# Patient Record
Sex: Female | Born: 1965 | Race: White | Hispanic: No | Marital: Single | Smoking: Current some day smoker
Health system: Southern US, Community
[De-identification: ages and names within clinical notes are randomized; demographics above are authoritative.]

## PROBLEM LIST (undated history)

## (undated) DIAGNOSIS — I639 Cerebral infarction, unspecified: Secondary | ICD-10-CM

## (undated) DIAGNOSIS — I10 Essential (primary) hypertension: Secondary | ICD-10-CM

## (undated) HISTORY — PX: CHOLECYSTECTOMY: SHX55

---

## 1998-01-08 ENCOUNTER — Emergency Department (HOSPITAL_COMMUNITY): Admission: EM | Admit: 1998-01-08 | Discharge: 1998-01-08 | Payer: Self-pay | Admitting: Emergency Medicine

## 1999-06-13 ENCOUNTER — Emergency Department (HOSPITAL_COMMUNITY): Admission: EM | Admit: 1999-06-13 | Discharge: 1999-06-13 | Payer: Self-pay | Admitting: Emergency Medicine

## 2000-08-26 ENCOUNTER — Encounter: Payer: Self-pay | Admitting: Emergency Medicine

## 2000-08-26 ENCOUNTER — Emergency Department (HOSPITAL_COMMUNITY): Admission: EM | Admit: 2000-08-26 | Discharge: 2000-08-26 | Payer: Self-pay | Admitting: Emergency Medicine

## 2002-03-20 ENCOUNTER — Emergency Department (HOSPITAL_COMMUNITY): Admission: EM | Admit: 2002-03-20 | Discharge: 2002-03-20 | Payer: Self-pay | Admitting: Emergency Medicine

## 2013-01-13 ENCOUNTER — Emergency Department (HOSPITAL_COMMUNITY): Payer: Disability Insurance

## 2013-01-13 ENCOUNTER — Emergency Department (HOSPITAL_COMMUNITY): Payer: Self-pay

## 2013-01-13 ENCOUNTER — Encounter (HOSPITAL_COMMUNITY): Payer: Self-pay

## 2013-01-13 ENCOUNTER — Emergency Department (HOSPITAL_COMMUNITY)
Admission: EM | Admit: 2013-01-13 | Discharge: 2013-01-13 | Disposition: A | Discharge status: Home / Self Care | Payer: Disability Insurance | Attending: Emergency Medicine | Admitting: Emergency Medicine

## 2013-01-13 DIAGNOSIS — Y9389 Activity, other specified: Secondary | ICD-10-CM | POA: Insufficient documentation

## 2013-01-13 DIAGNOSIS — F17200 Nicotine dependence, unspecified, uncomplicated: Secondary | ICD-10-CM | POA: Insufficient documentation

## 2013-01-13 DIAGNOSIS — T3991XA Poisoning by unspecified nonopioid analgesic, antipyretic and antirheumatic, accidental (unintentional), initial encounter: Secondary | ICD-10-CM | POA: Insufficient documentation

## 2013-01-13 DIAGNOSIS — Y929 Unspecified place or not applicable: Secondary | ICD-10-CM | POA: Insufficient documentation

## 2013-01-13 DIAGNOSIS — T398X1A Poisoning by other nonopioid analgesics and antipyretics, not elsewhere classified, accidental (unintentional), initial encounter: Secondary | ICD-10-CM | POA: Insufficient documentation

## 2013-01-13 DIAGNOSIS — J209 Acute bronchitis, unspecified: Secondary | ICD-10-CM | POA: Insufficient documentation

## 2013-01-13 DIAGNOSIS — R0789 Other chest pain: Secondary | ICD-10-CM | POA: Insufficient documentation

## 2013-01-13 DIAGNOSIS — L509 Urticaria, unspecified: Secondary | ICD-10-CM | POA: Insufficient documentation

## 2013-01-13 DIAGNOSIS — I10 Essential (primary) hypertension: Secondary | ICD-10-CM | POA: Insufficient documentation

## 2013-01-13 DIAGNOSIS — J40 Bronchitis, not specified as acute or chronic: Secondary | ICD-10-CM

## 2013-01-13 DIAGNOSIS — T7840XA Allergy, unspecified, initial encounter: Secondary | ICD-10-CM

## 2013-01-13 HISTORY — DX: Essential (primary) hypertension: I10

## 2013-01-13 LAB — COMPREHENSIVE METABOLIC PANEL
ALT: 43 U/L — ABNORMAL HIGH (ref 0–35)
AST: 26 U/L (ref 0–37)
Albumin: 4 g/dL (ref 3.5–5.2)
Alkaline Phosphatase: 74 U/L (ref 39–117)
BUN: 11 mg/dL (ref 6–23)
CO2: 28 mEq/L (ref 19–32)
Calcium: 9.8 mg/dL (ref 8.4–10.5)
Chloride: 99 mEq/L (ref 96–112)
Creatinine, Ser: 0.82 mg/dL (ref 0.50–1.10)
GFR calc Af Amer: >90 mL/min (ref >90)
GFR calc non Af Amer: 83 mL/min — ABNORMAL LOW (ref >90)
Glucose, Bld: 177 mg/dL — ABNORMAL HIGH (ref 70–99)
Potassium: 3.5 mEq/L (ref 3.5–5.1)
Sodium: 138 mEq/L (ref 135–145)
Total Bilirubin: 0.2 mg/dL — ABNORMAL LOW (ref 0.3–1.2)
Total Protein: 7.2 g/dL (ref 6.0–8.3)

## 2013-01-13 LAB — CBC WITH DIFFERENTIAL/PLATELET
Basophils Absolute: 0 10*3/uL (ref 0.0–0.1)
Eosinophils Relative: 2 % (ref 0–5)
Lymphocytes Relative: 14 % (ref 12–46)
Lymphs Abs: 1.4 10*3/uL (ref 0.7–4.0)
MCV: 89 fL (ref 78.0–100.0)
Neutro Abs: 8.3 10*3/uL — ABNORMAL HIGH (ref 1.7–7.7)
Platelets: 201 10*3/uL (ref 150–400)
RBC: 4.46 MIL/uL (ref 3.87–5.11)
WBC: 10.4 10*3/uL (ref 4.0–10.5)

## 2013-01-13 LAB — TROPONIN I: Troponin I: <0.3 ng/mL (ref <0.30)

## 2013-01-13 MED ORDER — ALBUTEROL SULFATE HFA 108 (90 BASE) MCG/ACT IN AERS
2.0000 | INHALATION_SPRAY | RESPIRATORY_TRACT | Status: DC | PRN
  Administered 2013-01-13: 2 via RESPIRATORY_TRACT
  Filled 2013-01-13: qty 6.7

## 2013-01-13 MED ORDER — AZITHROMYCIN 250 MG PO TABS: ORAL_TABLET | ORAL | Status: DC

## 2013-01-13 MED ORDER — ACETAMINOPHEN 325 MG PO TABS
650.0000 mg | ORAL_TABLET | Freq: Once | ORAL | Status: AC
  Administered 2013-01-13: 650 mg via ORAL
  Filled 2013-01-13: qty 2

## 2013-01-13 MED ORDER — DIPHENHYDRAMINE HCL 25 MG PO TABS: 50.0000 mg | ORAL_TABLET | Freq: Four times a day (QID) | ORAL | Status: DC | PRN

## 2013-01-13 MED ORDER — SODIUM CHLORIDE 0.9 % IV SOLN
Freq: Once | INTRAVENOUS | Status: AC
  Administered 2013-01-13: 20 mL/h via INTRAVENOUS

## 2013-01-13 MED ORDER — ALBUTEROL SULFATE (5 MG/ML) 0.5% IN NEBU
5.0000 mg | INHALATION_SOLUTION | Freq: Once | RESPIRATORY_TRACT | Status: AC
  Administered 2013-01-13: 5 mg via RESPIRATORY_TRACT
  Filled 2013-01-13: qty 1
  Filled 2013-01-13: qty 0.5

## 2013-01-13 MED ORDER — IPRATROPIUM BROMIDE 0.02 % IN SOLN
0.5000 mg | Freq: Once | RESPIRATORY_TRACT | Status: AC
  Administered 2013-01-13: 0.5 mg via RESPIRATORY_TRACT
  Filled 2013-01-13: qty 2.5

## 2013-01-13 MED ORDER — DEXAMETHASONE 10 MG/ML FOR PEDIATRIC ORAL USE
10.0000 mg | Freq: Once | INTRAMUSCULAR | Status: AC
  Administered 2013-01-13: 10 mg via ORAL
  Filled 2013-01-13: qty 1

## 2013-01-13 MED ORDER — FAMOTIDINE IN NACL 20-0.9 MG/50ML-% IV SOLN
20.0000 mg | Freq: Once | INTRAVENOUS | Status: AC
  Administered 2013-01-13: 20 mg via INTRAVENOUS
  Filled 2013-01-13: qty 50

## 2013-01-13 MED ORDER — DIPHENHYDRAMINE HCL 50 MG/ML IJ SOLN
25.0000 mg | Freq: Once | INTRAMUSCULAR | Status: AC
  Administered 2013-01-13: 25 mg via INTRAVENOUS
  Filled 2013-01-13: qty 1

## 2013-01-13 NOTE — ED Provider Notes (Signed)
CSN: [id number]     Arrival date & time 01/13/13  0002 History   First MD Initiated Contact with Patient 01/13/13 0054     Chief Complaint  Patient presents with  . Allergic Reaction   (Consider location/radiation/quality/duration/timing/severity/associated sxs/prior Treatment) HPI Is a 47 year old female who has had symptoms of respiratory infection for the 4-5 days. Specifically she has had a fever, cough, chest congestion and chest soreness. Yesterday morning she took 2 different over-the-counter cough/cold medications and developed generalized hives. She took 25 mg of Benadryl at 6 PM yesterday and another 25 mg at 10 PM yesterday with some relief. The hives are described as itchy. The symptoms are moderate. They have been associated with worsening of her chest tightness. She states she's had difficulty taking a deep breath because of rib soreness anteriorly and she feels like she needs to cough but is having difficulty coughing. Her symptoms are worse when lying supine. She denies rhinorrhea or nasal congestion.  Past Medical History  Diagnosis Date  . Hypertension    Past Surgical History  Procedure Laterality Date  . Cholecystectomy     No family history on file. History  Substance Use Topics  . Smoking status: Current Some Day Smoker  . Smokeless tobacco: Not on file  . Alcohol Use: No   OB History   Grav Para Term Preterm Abortions TAB SAB Ect Mult Living                 Review of Systems  All other systems reviewed and are negative.    Allergies  Review of patient's allergies indicates no known allergies.  Home Medications   Current Outpatient Rx  Name  Route  Sig  Dispense  Refill  . dextromethorphan-guaiFENesin (MUCINEX DM) 30-600 MG per 12 hr tablet   Oral   Take 1 tablet by mouth every 12 (twelve) hours.         . diphenhydrAMINE (SOMINEX) 25 MG tablet   Oral   Take 25 mg by mouth at bedtime as needed for sleep.          BP 153/88  Pulse 80   Temp(Src) 99 F (37.2 C) (Oral)  Resp 20  Ht 5\' 6"  (1.676 m)  Wt 240 lb (108.863 kg)  BMI 38.76 kg/m2  SpO2 95%  LMP 01/10/2013  Physical Exam General: Well-developed, well-nourished female in no acute distress; appearance consistent with age of record HENT: normocephalic; atraumatic Eyes: pupils equal, round and reactive to light; extraocular muscles intact Neck: supple Heart: regular rate and rhythm Lungs: Rhonchi on inspiration and expiration, reduced air movement in right base Chest: Anterior rib tenderness bilaterally Abdomen: soft; nondistended; nontender; bowel sounds present Extremities: No deformity; full range of motion; pulses normal; no edema Neurologic: Awake, alert and oriented; motor function intact in all extremities and symmetric; no facial droop Skin: Warm and dry Psychiatric: Normal mood and affect    ED Course  Procedures (including critical care time)   MDM   Nursing notes and vitals signs, including pulse oximetry, reviewed.  Summary of this visit's results, reviewed by myself:  Labs:  Results for orders placed during the hospital encounter of 01/13/13 (from the past 24 hour(s))  CBC WITH DIFFERENTIAL     Status: Abnormal   Collection Time    01/13/13 12:45 AM      Result Value Range   WBC 10.4  4.0 - 10.5 K/uL   RBC 4.46  3.87 - 5.11 MIL/uL   Hemoglobin 13.4  12.0 - 15.0 g/dL   HCT 16.1  09.6 - 04.5 %   MCV 89.0  78.0 - 100.0 fL   MCH 30.0  26.0 - 34.0 pg   MCHC 33.8  30.0 - 36.0 g/dL   RDW 40.9  81.1 - 91.4 %   Platelets 201  150 - 400 K/uL   Neutrophils Relative % 79 (*) 43 - 77 %   Neutro Abs 8.3 (*) 1.7 - 7.7 K/uL   Lymphocytes Relative 14  12 - 46 %   Lymphs Abs 1.4  0.7 - 4.0 K/uL   Monocytes Relative 5  3 - 12 %   Monocytes Absolute 0.5  0.1 - 1.0 K/uL   Eosinophils Relative 2  0 - 5 %   Eosinophils Absolute 0.2  0.0 - 0.7 K/uL   Basophils Relative 0  0 - 1 %   Basophils Absolute 0.0  0.0 - 0.1 K/uL  COMPREHENSIVE METABOLIC  PANEL     Status: Abnormal   Collection Time    01/13/13 12:45 AM      Result Value Range   Sodium 138  135 - 145 mEq/L   Potassium 3.5  3.5 - 5.1 mEq/L   Chloride 99  96 - 112 mEq/L   CO2 28  19 - 32 mEq/L   Glucose, Bld 177 (*) 70 - 99 mg/dL   BUN 11  6 - 23 mg/dL   Creatinine, Ser 7.82  0.50 - 1.10 mg/dL   Calcium 9.8  8.4 - 95.6 mg/dL   Total Protein 7.2  6.0 - 8.3 g/dL   Albumin 4.0  3.5 - 5.2 g/dL   AST 26  0 - 37 U/L   ALT 43 (*) 0 - 35 U/L   Alkaline Phosphatase 74  39 - 117 U/L   Total Bilirubin 0.2 (*) 0.3 - 1.2 mg/dL   GFR calc non Af Amer 83 (*) >90 mL/min   GFR calc Af Amer >90  >90 mL/min  TROPONIN I     Status: None   Collection Time    01/13/13 12:45 AM      Result Value Range   Troponin I <0.30  <0.30 ng/mL    Imaging Studies: Dg Chest 1 View  01/13/2013   *RADIOLOGY REPORT*  Clinical Data: Allergic reaction, cough.  CHEST - 1 VIEW  Comparison: None available at time of study interpretation.  Findings: Mild habitus limited examination.  Cardiomediastinal silhouette is unremarkable.  The patient is mildly rotated to the right accentuating right pulmonary hilar vasculature.  No definite pleural effusions or focal consolidations.  Trachea projects midline and there is no pneumothorax.  EKG leads overlie the patient.  Thoracic dextroscoliosis.  IMPRESSION: No acute cardiopulmonary process.   Original Report Authenticated By: Awilda Metro   Dg Chest 2 View  01/13/2013   *RADIOLOGY REPORT*  Clinical Data: Allergic reaction.  CHEST - 2 VIEW  Comparison: Chest radiograph January 10, 2013.  Findings: Cardiomediastinal silhouette is unremarkable and unchanged.  More apparent mild perihilar peribronchial cuffing. There is no pleural effusions or focal consolidations.  Pulmonary vasculature is unremarkable.  Trachea projects midline and there is no pneumothorax.  Multiple EKG leads overlie the patient.  Mild mid thoracic dextroscoliosis.  IMPRESSION: Mild perihilar  peribronchial cuffing which could reflect early pulmonary edema or bronchitis/reactive airway disease.   Original Report Authenticated By: Awilda Metro      EKG Interpretation:  Date & Time: 01/13/2013 12:12 AM  Rate: 87  Rhythm: normal sinus rhythm  QRS Axis: left  Intervals: normal  ST/T Wave abnormalities: Inferior T wave inversions  Conduction Disutrbances:none  Narrative Interpretation: Poor R-wave progression  Old EKG Reviewed: none available  3:51 AM Urticaria resolved after IV Benadryl and Pepcid. Air movement improved after albuterol and Atrovent neb treatment; patient has significant subjective improvement in her breathing and is now able to cough more freely.    Hanley Seamen, MD 01/13/13 [phone]

## 2013-01-13 NOTE — ED Notes (Signed)
Took benadryl at 1800 and repeated at 22:00.  States that seems to have helped her relax some.

## 2013-01-13 NOTE — ED Notes (Signed)
Pt reports she has been coughing and had congestion, states she took 2 different types of mucinex type meds and thinks she may have taken too much.  Pt states she broke out in hives and is having tightness in her chest and shoulders, also is itching.

## 2013-01-13 NOTE — ED Notes (Signed)
Up to bathroom and when returned to room shows urticaria returning on chest.  States she thinks she scratched it while asleep.  MD informed and order received

## 2013-01-13 NOTE — ED Notes (Addendum)
C/o extreme thirst.  Generalized headache.  Took a BC powder this pm  Thinks she may have been running a fever all day

## 2013-06-24 ENCOUNTER — Encounter (HOSPITAL_COMMUNITY): Payer: Self-pay | Admitting: Emergency Medicine

## 2013-06-24 ENCOUNTER — Emergency Department (HOSPITAL_COMMUNITY)
Admission: EM | Admit: 2013-06-24 | Discharge: 2013-06-24 | Discharge status: Against Medical Advice | Payer: Disability Insurance | Attending: Emergency Medicine | Admitting: Emergency Medicine

## 2013-06-24 DIAGNOSIS — Z8673 Personal history of transient ischemic attack (TIA), and cerebral infarction without residual deficits: Secondary | ICD-10-CM | POA: Insufficient documentation

## 2013-06-24 DIAGNOSIS — G43909 Migraine, unspecified, not intractable, without status migrainosus: Secondary | ICD-10-CM | POA: Insufficient documentation

## 2013-06-24 DIAGNOSIS — F17200 Nicotine dependence, unspecified, uncomplicated: Secondary | ICD-10-CM | POA: Insufficient documentation

## 2013-06-24 DIAGNOSIS — Z79899 Other long term (current) drug therapy: Secondary | ICD-10-CM | POA: Insufficient documentation

## 2013-06-24 DIAGNOSIS — I10 Essential (primary) hypertension: Secondary | ICD-10-CM | POA: Insufficient documentation

## 2013-06-24 HISTORY — DX: Cerebral infarction, unspecified: I63.9

## 2013-06-24 MED ORDER — SODIUM CHLORIDE 0.9 % IV BOLUS (SEPSIS): 1000.0000 mL | Freq: Once | INTRAVENOUS | Status: DC

## 2013-06-24 MED ORDER — METOCLOPRAMIDE HCL 5 MG/ML IJ SOLN
10.0000 mg | Freq: Once | INTRAMUSCULAR | Status: DC
  Filled 2013-06-24: qty 2

## 2013-06-24 MED ORDER — DIPHENHYDRAMINE HCL 50 MG/ML IJ SOLN
25.0000 mg | Freq: Once | INTRAMUSCULAR | Status: DC
  Filled 2013-06-24: qty 1

## 2013-06-24 NOTE — ED Notes (Signed)
Unable to locate pt  

## 2013-06-24 NOTE — ED Notes (Signed)
Complain of headache and nausea. State she has a history of migraines

## 2013-06-24 NOTE — ED Provider Notes (Signed)
CSN: [id number]     Arrival date & time 06/24/13  1200 History   First MD Initiated Contact with Patient 06/24/13 1302     Chief Complaint  Patient presents with  . Headache     (Consider location/radiation/quality/duration/timing/severity/associated sxs/prior Treatment) HPI Patient reports she has had migraine type headaches for years. She states she gets them about every other week. She typically tries to rest, take BC powders, or put a cold rag on her head to make it ease up or go away. She states she has recently stopped the [facility] powder because of the sodium content. She reports this headache started 4 days ago. She indicates it's in her temples bilaterally. She states the headache is constant and throbbing. She has nausea without vomiting. She denies any visual disturbance. She denies any numbness or tingling of her extremities. She does have photophobia and some milder noise sensitivity. She states this is just like headache she's had in the past.  PCP [facility]  First appt on March 24 [facility]   Past Medical History  Diagnosis Date  . Hypertension   . Stroke    Past Surgical History  Procedure Laterality Date  . Cholecystectomy     No family history on file. History  Substance Use Topics  . Smoking status: Current Some Day Smoker  . Smokeless tobacco: Not on file  . Alcohol Use: No   Applying for disability for bipolar and back problems Smoking 2 cig a day, trying to quit   OB History   Grav Para Term Preterm Abortions TAB SAB Ect Mult Living                 Review of Systems  All other systems reviewed and are negative.      Allergies  Review of patient's allergies indicates no known allergies.  Home Medications   Current Outpatient Rx  Name  Route  Sig  Dispense  Refill  . Aspirin-Salicylamide-Caffeine (BC HEADACHE PO)   Oral   Take 1 Package by mouth 2 (two) times daily as needed (headache).         Marland Kitchen  FLUoxetine (PROZAC) 20 MG capsule   Oral   Take 20 mg by mouth daily.         . traZODone (DESYREL) 100 MG tablet   Oral   Take 100 mg by mouth at bedtime.          BP 172/92  Pulse 79  Temp(Src) 97.8 F (36.6 C)  Resp 20  Ht 5\' 6"  (1.676 m)  Wt 250 lb (113.399 kg)  BMI 40.37 kg/m2  SpO2 100%  LMP 06/18/2013  Vital signs normal except hypertension  Physical Exam  Nursing note and vitals reviewed. Constitutional: She is oriented to person, place, and time. She appears well-developed and well-nourished.  Non-toxic appearance. She does not appear ill. No distress.  HENT:  Head: Normocephalic and atraumatic.  Right Ear: External ear normal.  Left Ear: External ear normal.  Nose: Nose normal. No mucosal edema or rhinorrhea.  Mouth/Throat: Oropharynx is clear and moist and mucous membranes are normal. No dental abscesses or uvula swelling.  Eyes: Conjunctivae and EOM are normal. Pupils are equal, round, and reactive to light.  Neck: Normal range of motion and full passive range of motion without pain. Neck supple.  Cardiovascular: Normal rate, regular rhythm and normal heart sounds.  Exam reveals no gallop and no friction rub.   No murmur heard. Pulmonary/Chest: Effort normal and  breath sounds normal. No respiratory distress. She has no wheezes. She has no rhonchi. She has no rales. She exhibits no tenderness and no crepitus.  Abdominal: Soft. Normal appearance and bowel sounds are normal. She exhibits no distension. There is no tenderness. There is no rebound and no guarding.  Musculoskeletal: Normal range of motion. She exhibits no edema and no tenderness.  Moves all extremities well.   Neurological: She is alert and oriented to person, place, and time. She has normal strength. No cranial nerve deficit.  Skin: Skin is warm, dry and intact. No rash noted. No erythema. No pallor.  Psychiatric: She has a normal mood and affect. Her speech is normal and behavior is normal. Her  mood appears not anxious.    ED Course  Procedures (including critical care time)  Medications  sodium chloride 0.9 % bolus 1,000 mL (not administered)  metoCLOPramide (REGLAN) injection 10 mg (not administered)  diphenhydrAMINE (BENADRYL) injection 25 mg (not administered)    14:10 nurses report patient not in her room. Pt did not receive ordered medication. Pt also did not indicate to me she had to leave or was unhappy with her plan.   Labs Review Labs Reviewed - No data to display Imaging Review No results found.  @NOMUSE @  MDM   Final diagnoses:  Migraine headache  Hypertension    Pt left AMA   Devoria AlbeIva Jeena Arnett, MD, Armando GangFACEP     Ward GivensIva L Clarann Helvey, MD 06/24/13 [phone]

## 2013-06-24 NOTE — ED Notes (Signed)
Went to room to medicate pt, pt not in room, EDP aware.

## 2013-08-11 ENCOUNTER — Other Ambulatory Visit (HOSPITAL_COMMUNITY): Payer: Self-pay | Admitting: Family Medicine

## 2013-08-11 ENCOUNTER — Ambulatory Visit (HOSPITAL_COMMUNITY)
Admission: RE | Admit: 2013-08-11 | Discharge: 2013-08-11 | Disposition: A | Discharge status: Home / Self Care | Payer: Disability Insurance | Source: Ambulatory Visit | Attending: Family Medicine | Admitting: Family Medicine

## 2013-08-11 DIAGNOSIS — Q762 Congenital spondylolisthesis: Secondary | ICD-10-CM | POA: Insufficient documentation

## 2013-08-11 DIAGNOSIS — M545 Low back pain, unspecified: Secondary | ICD-10-CM

## 2013-08-11 DIAGNOSIS — M47817 Spondylosis without myelopathy or radiculopathy, lumbosacral region: Secondary | ICD-10-CM | POA: Insufficient documentation

## 2013-11-08 ENCOUNTER — Other Ambulatory Visit (HOSPITAL_COMMUNITY): Payer: Self-pay | Admitting: *Deleted

## 2013-11-08 DIAGNOSIS — Z1231 Encounter for screening mammogram for malignant neoplasm of breast: Secondary | ICD-10-CM

## 2013-11-14 ENCOUNTER — Ambulatory Visit (HOSPITAL_COMMUNITY): Payer: Disability Insurance

## 2013-11-21 ENCOUNTER — Ambulatory Visit (HOSPITAL_COMMUNITY): Payer: Disability Insurance

## 2013-12-05 ENCOUNTER — Ambulatory Visit (HOSPITAL_COMMUNITY): Payer: Disability Insurance

## 2014-08-22 IMAGING — CR DG CHEST 2V
2 series · 2 of 2 positions shown · non-contrast
Comparison: Chest radiograph January 10, 2013.

CLINICAL DATA: Allergic reaction.

CHEST - 2 VIEW

[view not recorded (1 of 2)]
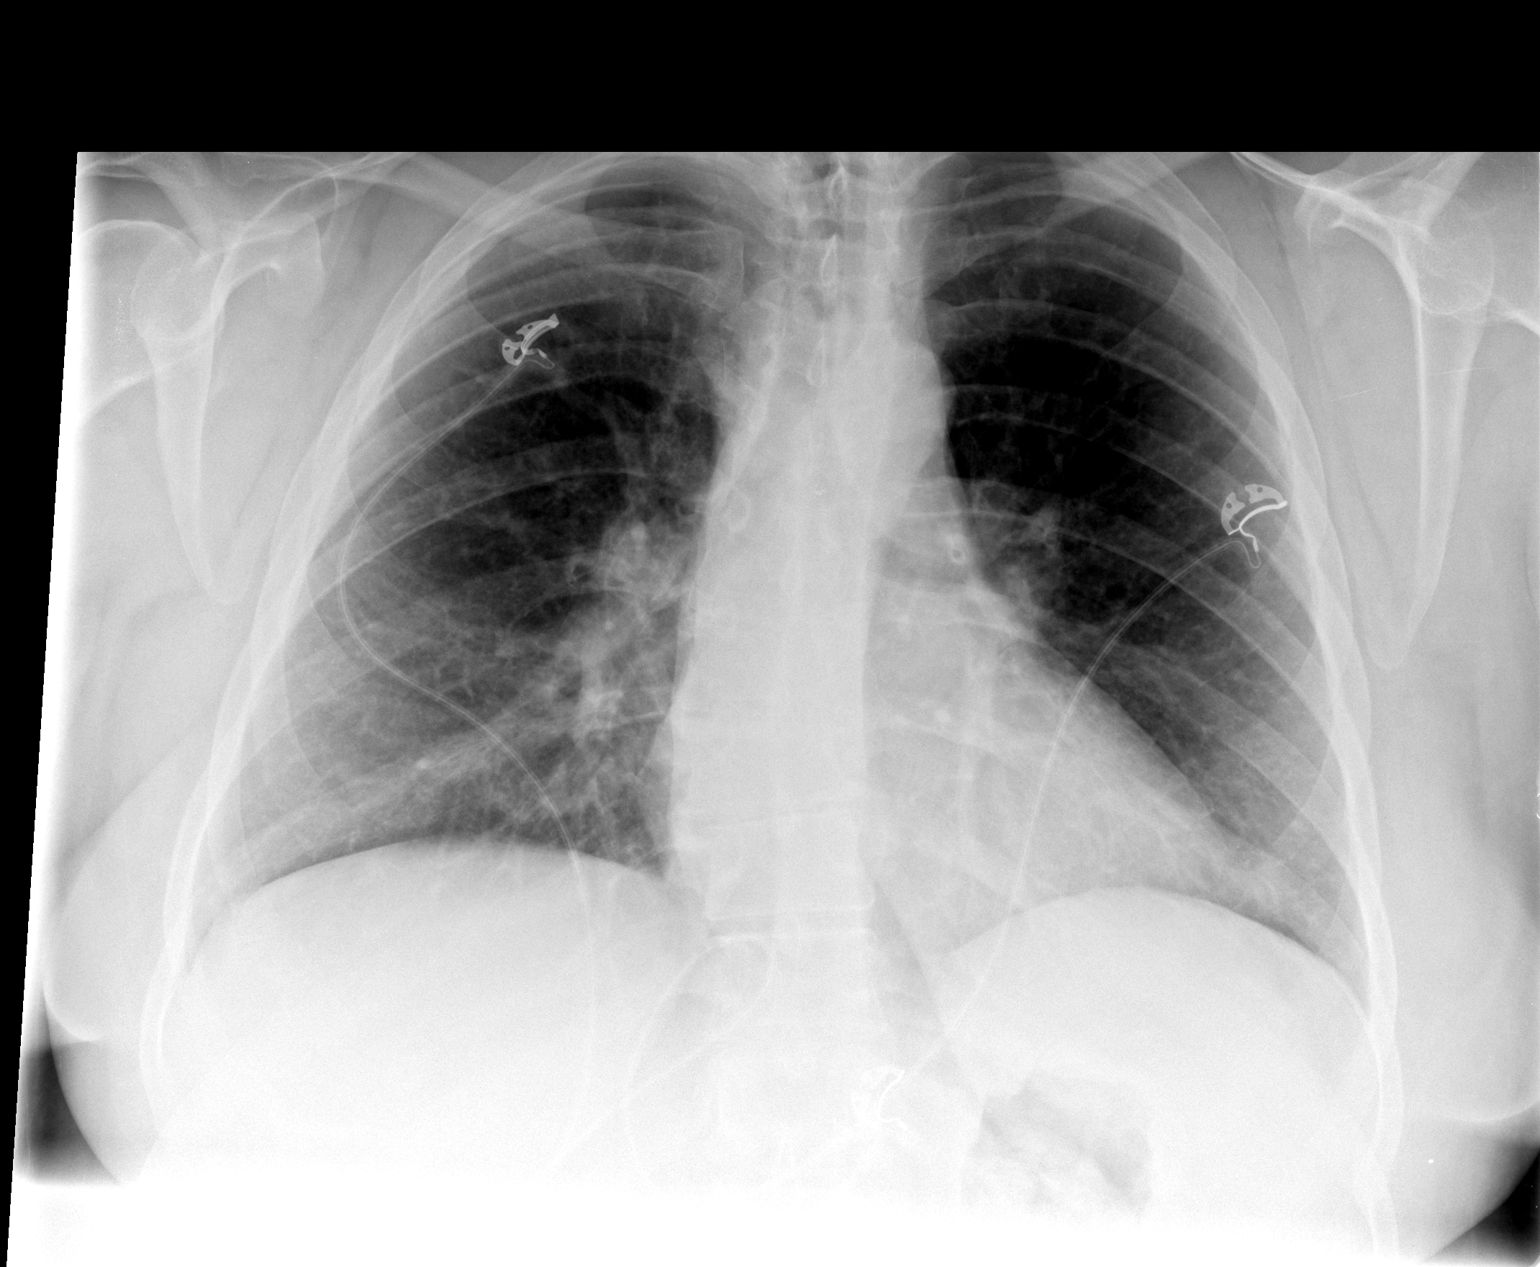

[view not recorded (2 of 2)]
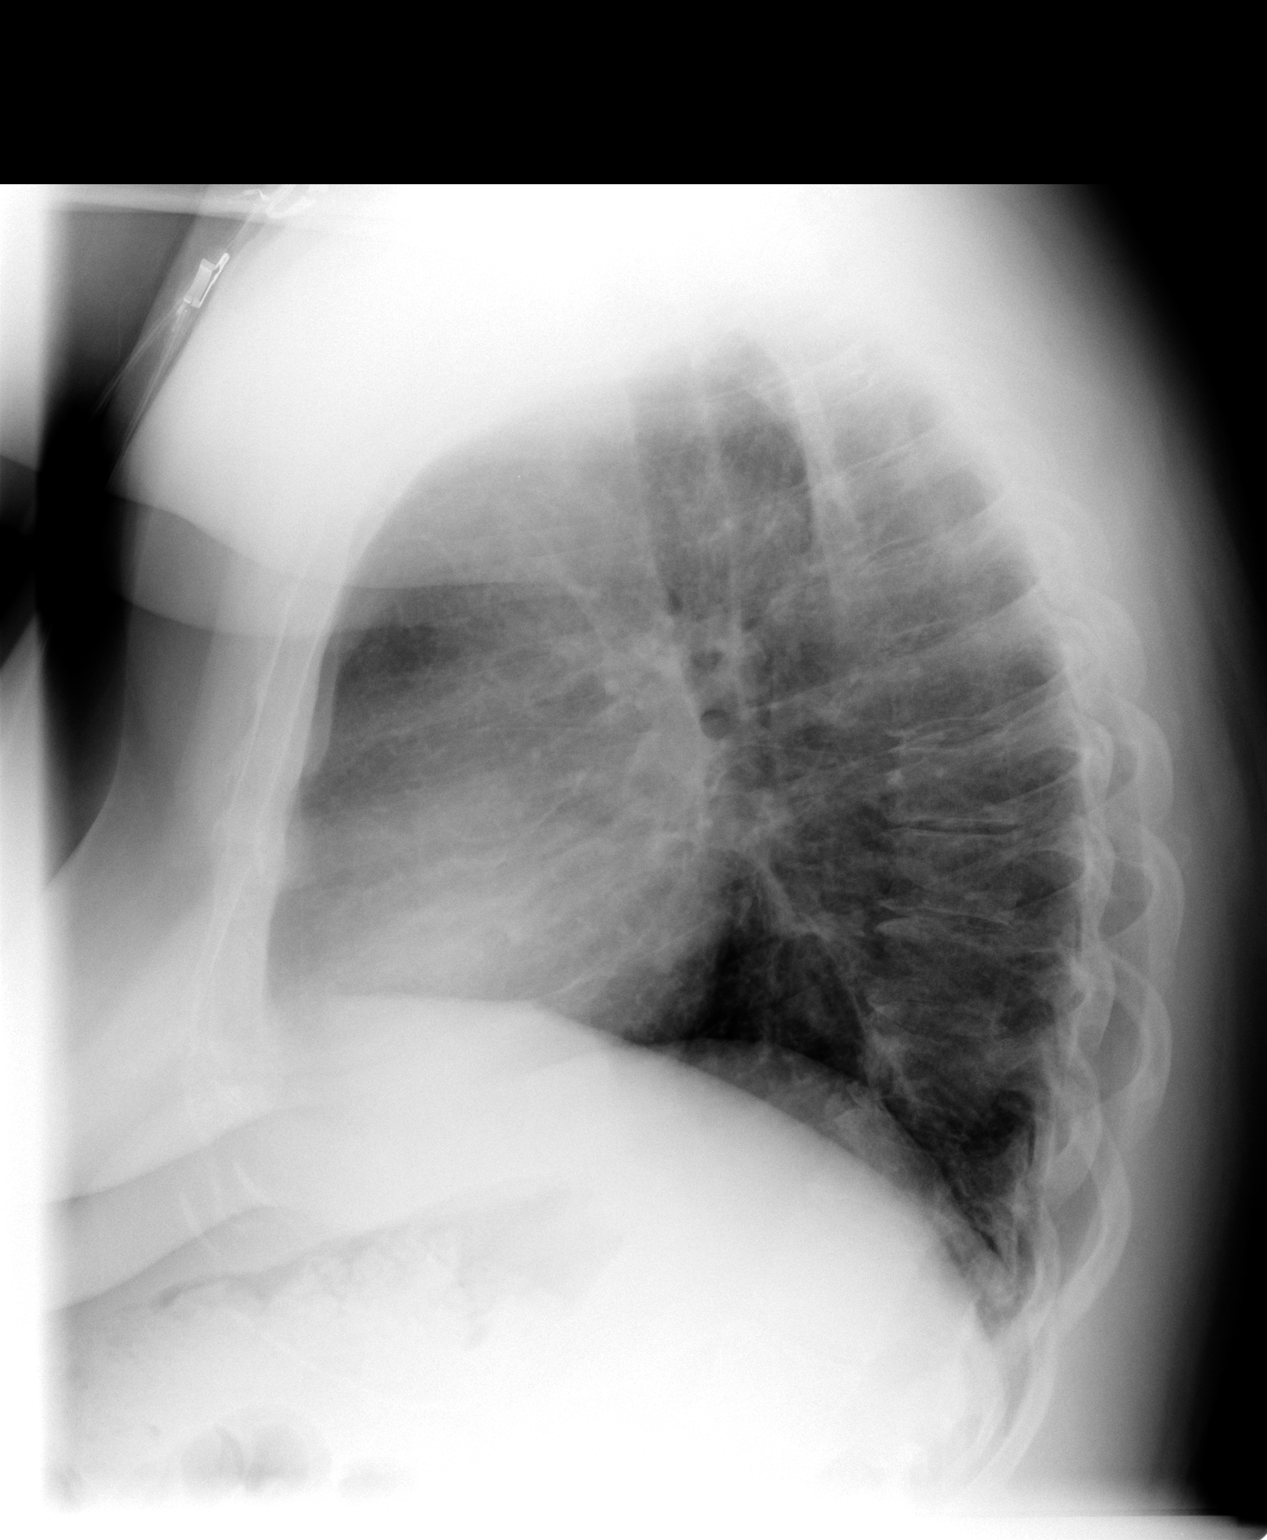

[2 of 2 positions shown; findings below may reference images not displayed]

FINDINGS: Cardiomediastinal silhouette is unremarkable and
unchanged.  More apparent mild perihilar peribronchial cuffing.
There is no pleural effusions or focal consolidations.  Pulmonary
vasculature is unremarkable.  Trachea projects midline and there is
no pneumothorax.

Multiple EKG leads overlie the patient.  Mild mid thoracic
dextroscoliosis.
IMPRESSION: Mild perihilar peribronchial cuffing which could reflect early
pulmonary edema or bronchitis/reactive airway disease.

## 2015-03-20 IMAGING — CR DG LUMBAR SPINE 2-3V
3 series · 3 of 3 positions shown · non-contrast
Comparison: None.

CLINICAL DATA: Chronic low back pain.

EXAM:
LUMBAR SPINE - 2-3 VIEW

[view not recorded (1 of 3)]
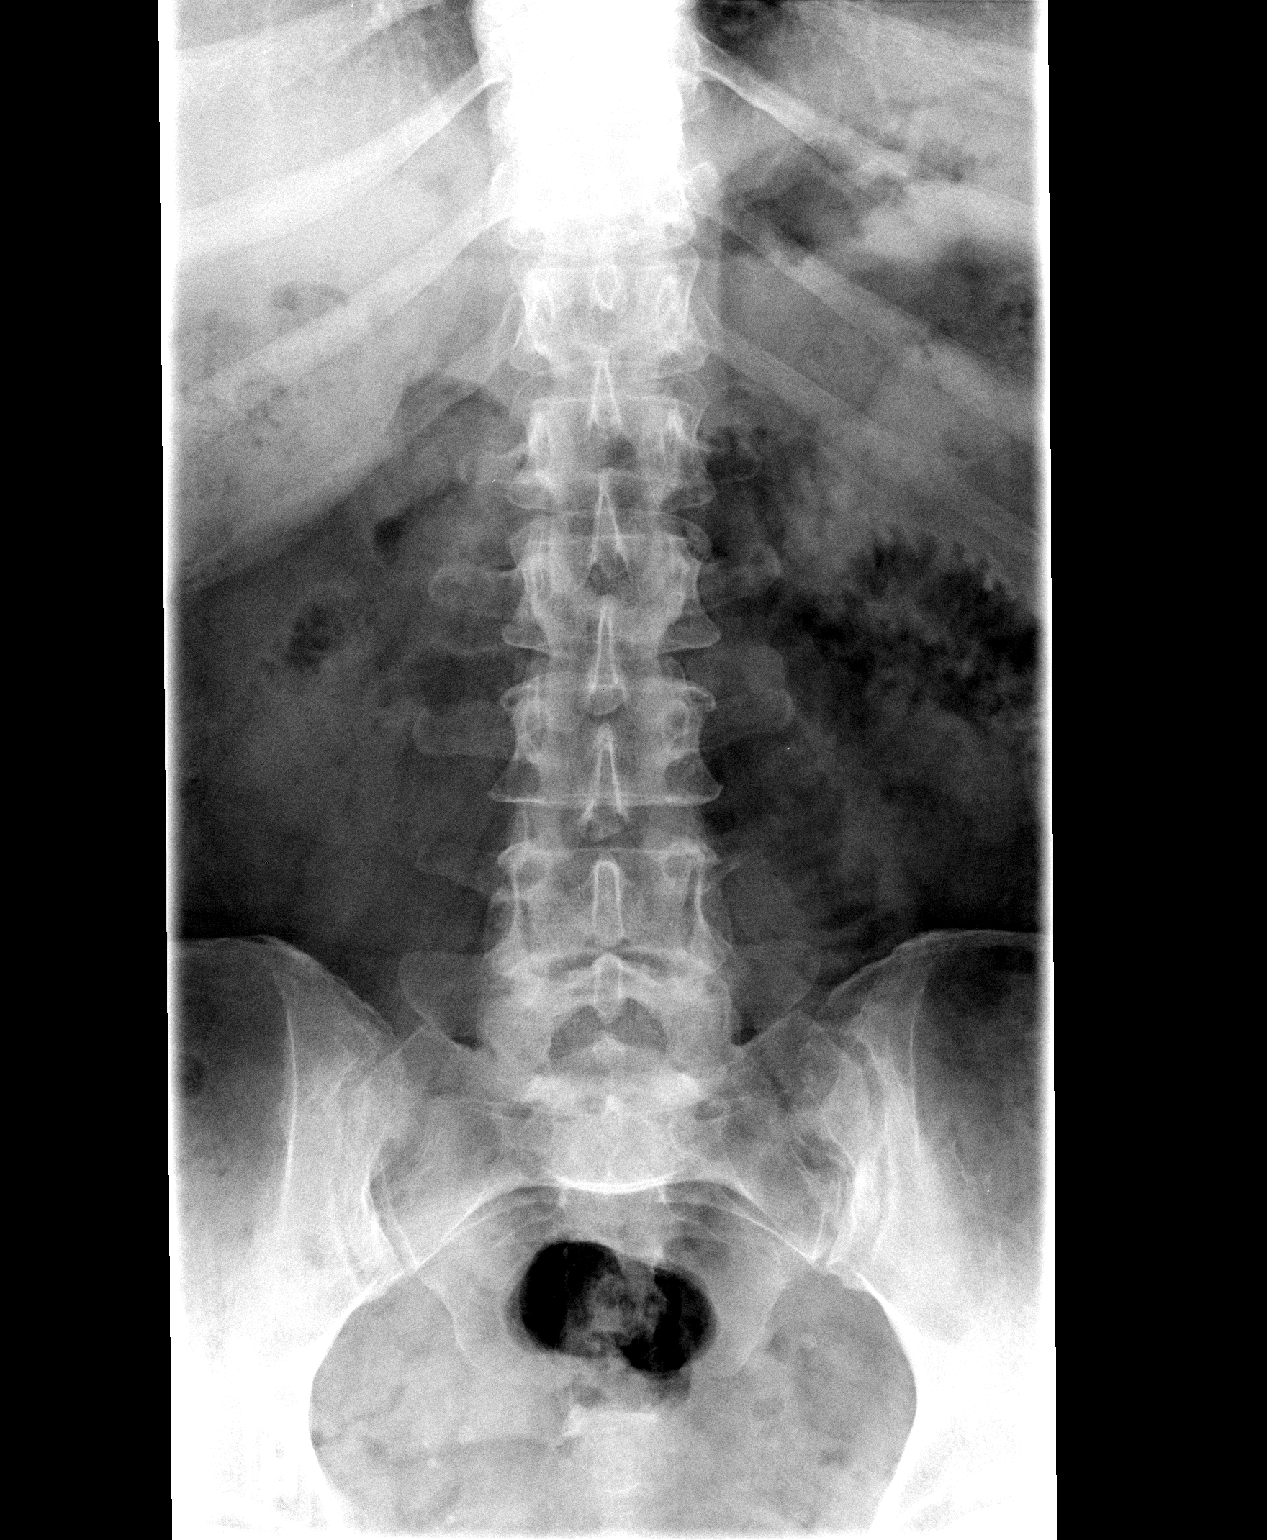

[view not recorded (2 of 3)]
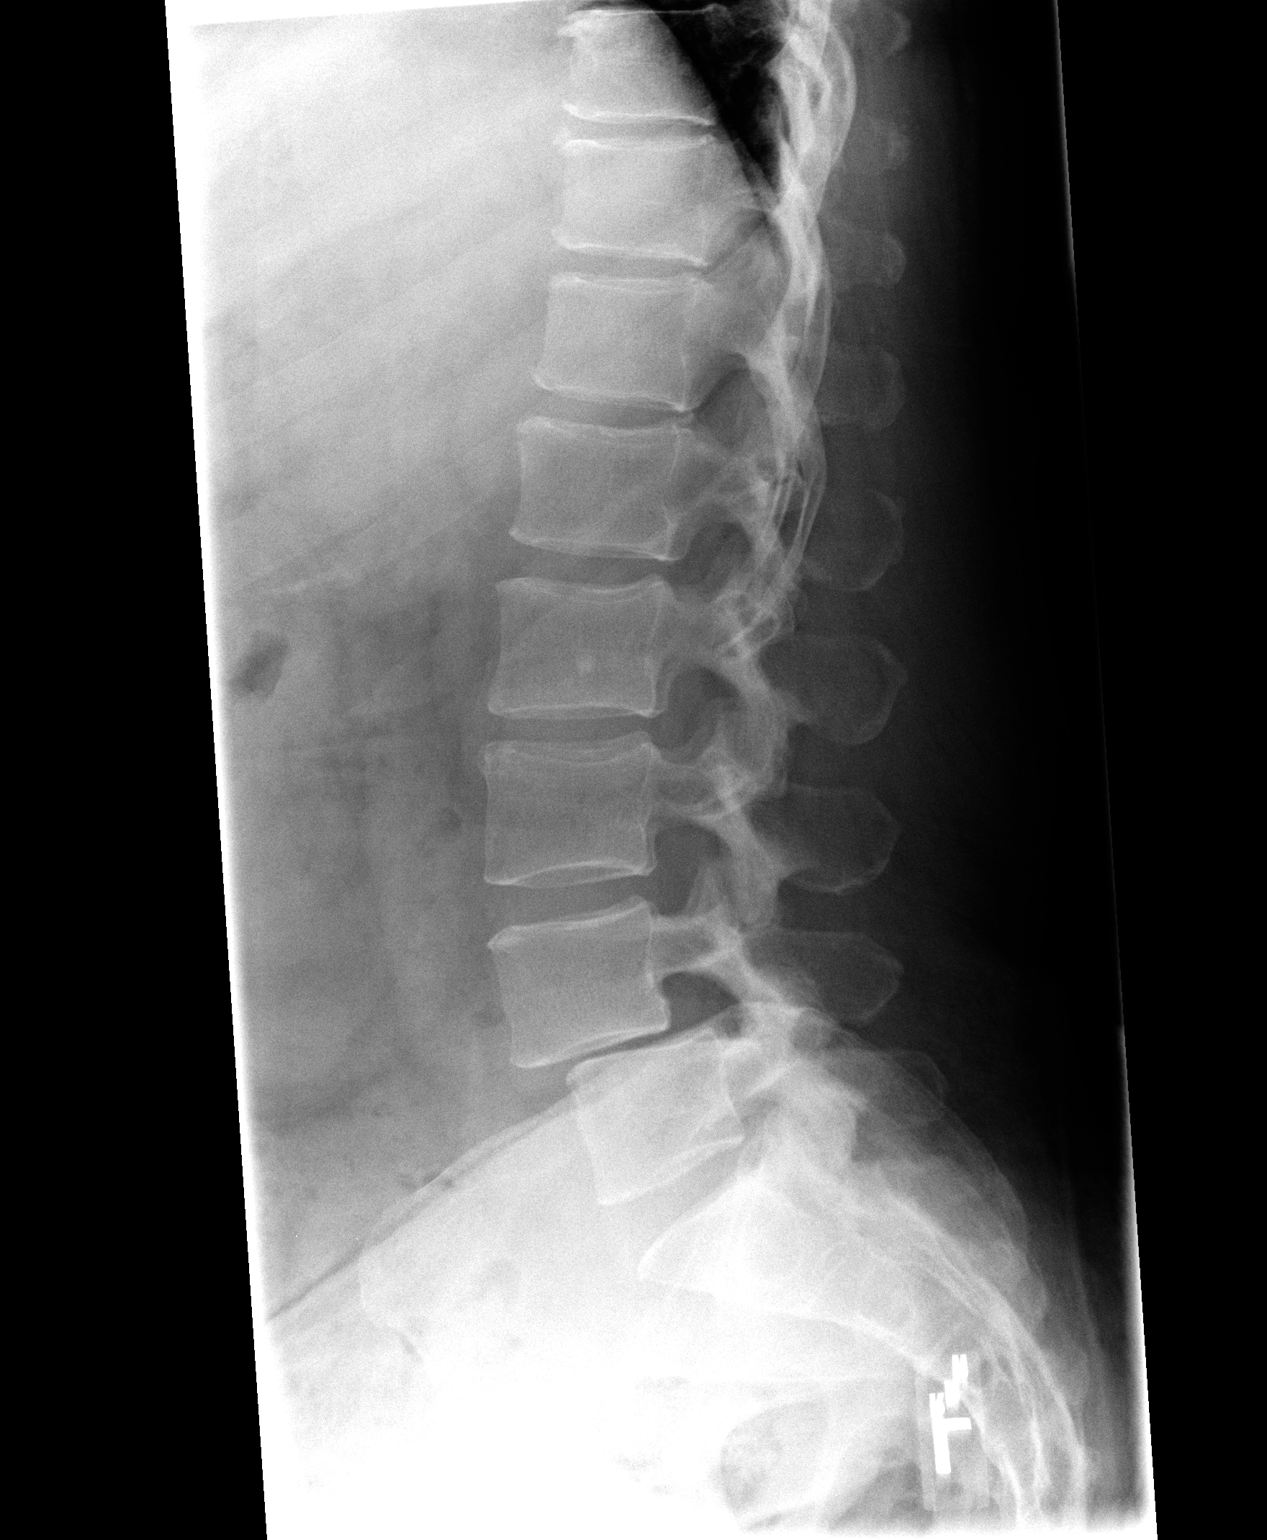

[view not recorded (3 of 3)]
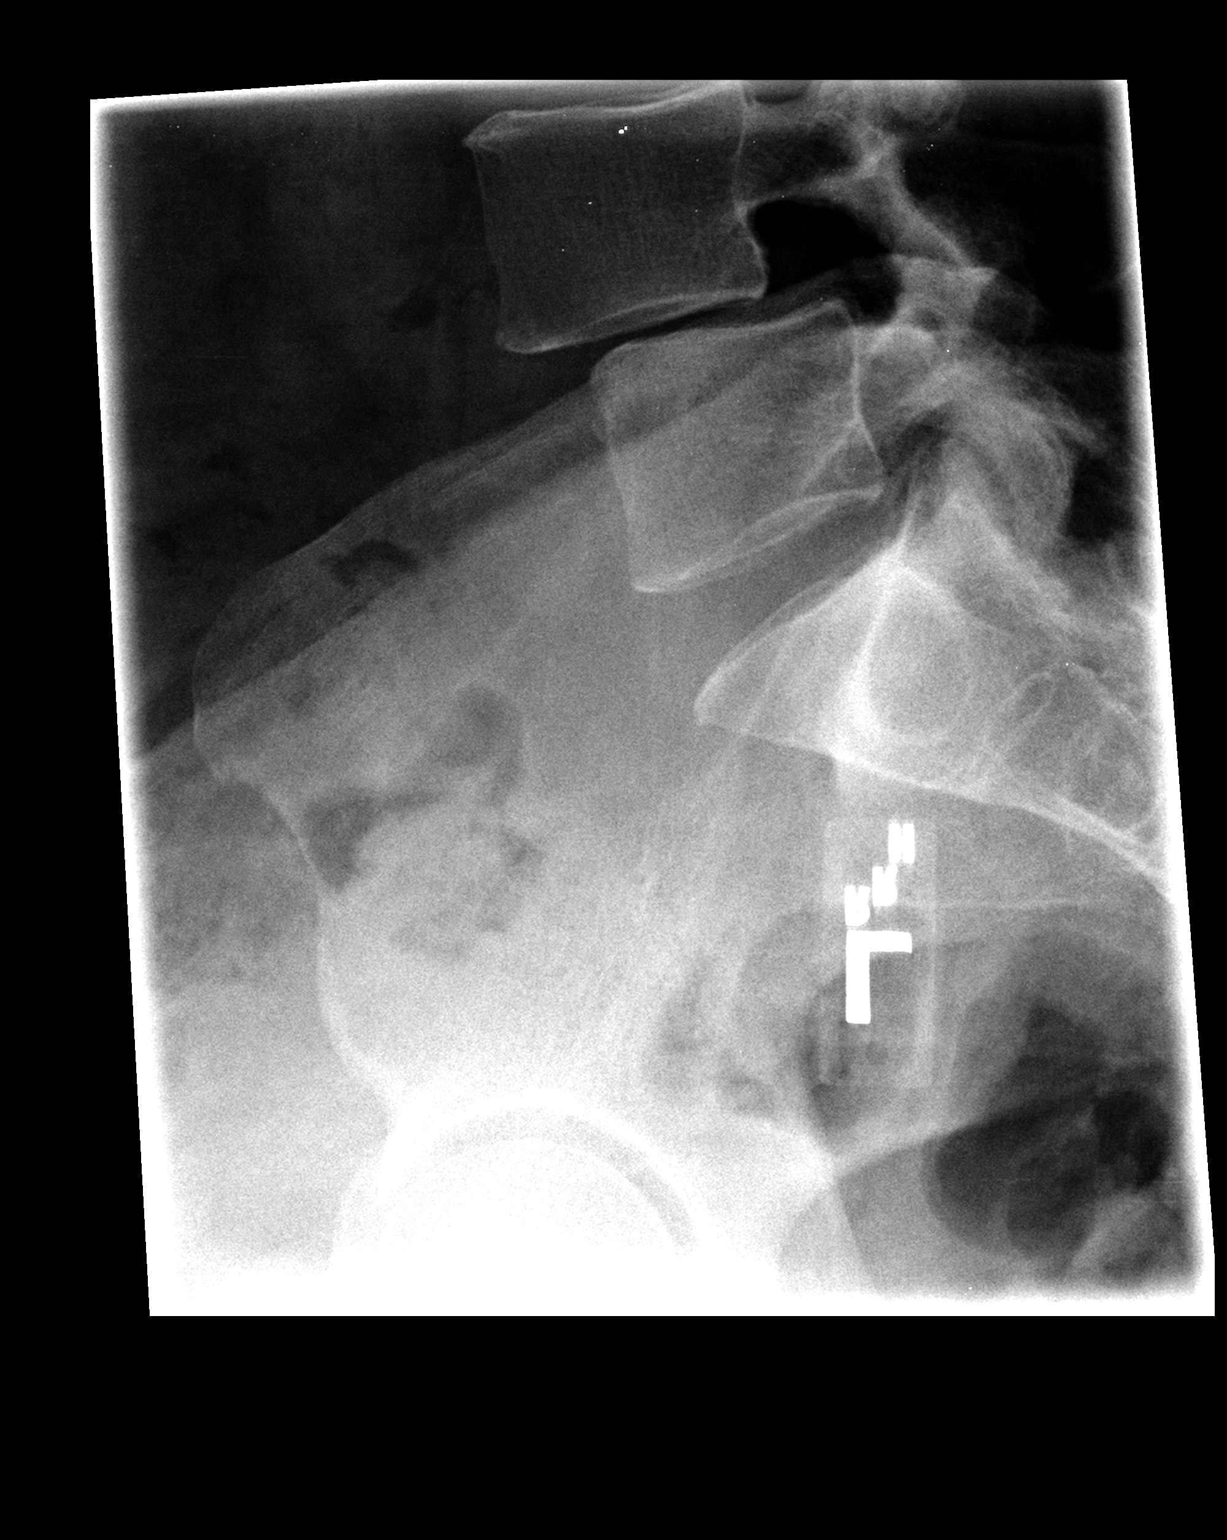

[3 of 3 positions shown; findings below may reference images not displayed]

FINDINGS: There is an 11 mm spondylolisthesis of L4 on L5 with marked
narrowing of the disc space. There are no pars defects. There is
bilateral facet arthritis at L4-5.

The remainder of the lumbar spine is normal.
IMPRESSION: 11 mm spondylolisthesis of L4 on L5 with severe facet arthritis.
L4-5 disc space narrowing.

## 2019-05-30 DEATH — deceased
